# Patient Record
Sex: Female | Born: 1950 | Race: Black or African American | Hispanic: No | Marital: Single | Smoking: Never smoker
Health system: Southern US, Community
[De-identification: ages and names within clinical notes are randomized; demographics above are authoritative.]

## PROBLEM LIST (undated history)

## (undated) DIAGNOSIS — I10 Essential (primary) hypertension: Secondary | ICD-10-CM

## (undated) DIAGNOSIS — E119 Type 2 diabetes mellitus without complications: Secondary | ICD-10-CM

---

## 2004-10-09 ENCOUNTER — Ambulatory Visit: Payer: Self-pay | Admitting: General Practice

## 2006-02-06 ENCOUNTER — Emergency Department: Payer: Self-pay

## 2006-02-06 ENCOUNTER — Other Ambulatory Visit: Payer: Self-pay

## 2007-01-21 ENCOUNTER — Ambulatory Visit: Payer: Self-pay

## 2008-06-19 ENCOUNTER — Ambulatory Visit: Payer: Self-pay

## 2009-02-18 ENCOUNTER — Ambulatory Visit: Payer: Self-pay | Admitting: Nephrology

## 2009-12-18 ENCOUNTER — Ambulatory Visit: Payer: Self-pay | Admitting: Family Medicine

## 2010-09-15 ENCOUNTER — Ambulatory Visit: Payer: Self-pay | Admitting: Family Medicine

## 2011-03-06 ENCOUNTER — Ambulatory Visit: Payer: Self-pay | Admitting: Gastroenterology

## 2011-03-10 LAB — PATHOLOGY REPORT

## 2011-10-29 ENCOUNTER — Ambulatory Visit: Payer: Self-pay | Admitting: Family Medicine

## 2012-10-31 ENCOUNTER — Ambulatory Visit: Payer: Self-pay | Admitting: Nurse Practitioner

## 2013-11-23 ENCOUNTER — Ambulatory Visit: Payer: Self-pay | Admitting: Family Medicine

## 2014-05-25 ENCOUNTER — Other Ambulatory Visit: Admit: 2014-05-25 | Disposition: A | Discharge status: Home / Self Care | Payer: Self-pay | Admitting: Nephrology

## 2014-05-25 LAB — BASIC METABOLIC PANEL
Anion Gap: 4 — ABNORMAL LOW (ref 7–16)
BUN: 18 mg/dL
CALCIUM: 9.4 mg/dL
CHLORIDE: 106 mmol/L
CO2: 29 mmol/L
Creatinine: 0.9 mg/dL
EGFR (African American): >60
Glucose: 111 mg/dL — ABNORMAL HIGH
Potassium: 4.2 mmol/L
Sodium: 139 mmol/L

## 2014-05-25 LAB — URINALYSIS, COMPLETE
Bacteria: NONE SEEN
Bilirubin,UR: NEGATIVE
Glucose,UR: NEGATIVE mg/dL (ref 0–75)
Ketone: NEGATIVE
LEUKOCYTE ESTERASE: NEGATIVE
Nitrite: NEGATIVE
PH: 5 (ref 4.5–8.0)
Protein: NEGATIVE
Specific Gravity: 1.012 (ref 1.003–1.030)

## 2014-05-25 LAB — PROTEIN / CREATININE RATIO, URINE
Creatinine, Urine Random: 73 mg/dL (ref 30–125)
PROTEIN, URINE: 8 mg/dL (ref 0–9)
Protein/Creat. Ratio: 110 mg/gCREAT (ref 0–200)

## 2015-03-06 MED FILL — metFORMIN HCL 500 MG TABS: 500 | 90 days supply | Qty: 180 | Fill #0

## 2015-03-18 ENCOUNTER — Ambulatory Visit: Payer: Self-pay | Attending: Oncology

## 2015-03-18 ENCOUNTER — Ambulatory Visit
Admission: RE | Admit: 2015-03-18 | Discharge: 2015-03-18 | Disposition: A | Discharge status: Home / Self Care | Payer: Self-pay | Source: Ambulatory Visit | Attending: Oncology | Admitting: Oncology

## 2015-03-18 VITALS — BP 125/83 | HR 83 | Temp 98.3°F | Resp 16 | Ht 59.84 in | Wt 160.9 lb

## 2015-03-18 DIAGNOSIS — Z0000 Encounter for general adult medical examination without abnormal findings: Secondary | ICD-10-CM

## 2015-03-18 NOTE — Progress Notes (Signed)
Subjective:     Patient ID: Stacy Khan, female   DOB: 1950-06-26, 65 y.o.   MRN: [id number]  HPI   Review of Systems     Objective:   Physical Exam  Pulmonary/Chest: Right breast exhibits no inverted nipple, no mass, no nipple discharge, no skin change and no tenderness. Left breast exhibits no inverted nipple, no mass, no nipple discharge, no skin change and no tenderness. Breasts are symmetrical.       Assessment:     65 year old patient presents for [facility] clinic visit.  Patient screened, and meets BCCCP eligibility.  Patient does not have insurance, Medicare or Medicaid.  Handout given on Affordable Care Act. Instructed patient on breast self-exam using teach back method. CBE unremarkable.  Patient had right nephrectomy in 1980.  Wears bilateral hearing aids. Had meningitis also in 1980's.    Plan:     Sent for bilateral screening mammogram.

## 2015-03-19 NOTE — Progress Notes (Signed)
Letter mailed from [facility] to notify of normal mammogram results.  Patient to return in one year for annual screening.  Copy to [facility]. 

## 2015-04-15 MED FILL — LISINOPRIL 5 MG TABLET: 5 | 90 days supply | Qty: 90 | Fill #0

## 2015-06-12 MED FILL — metFORMIN HCL 500 MG TABS: 500 | 90 days supply | Qty: 180 | Fill #1

## 2015-08-15 MED FILL — LISINOPRIL 5 MG TABLET: 5 | 90 days supply | Qty: 90 | Fill #0

## 2015-09-26 MED FILL — metFORMIN HCL 500 MG TABS: 500 | 90 days supply | Qty: 180 | Fill #2

## 2015-12-09 MED FILL — LISINOPRIL 5 MG TABLET: 5 | 90 days supply | Qty: 90 | Fill #1

## 2015-12-23 MED FILL — metFORMIN HCL 500 MG TABS: 500 | 90 days supply | Qty: 180 | Fill #3

## 2016-03-27 MED FILL — metFORMIN HCL 500 MG TABS: 500 | 90 days supply | Qty: 180 | Fill #0

## 2016-04-01 MED FILL — LISINOPRIL 5 MG TABLET: 5 | 90 days supply | Qty: 90 | Fill #0

## 2016-04-28 ENCOUNTER — Other Ambulatory Visit: Payer: Self-pay | Admitting: Family Medicine

## 2016-04-28 DIAGNOSIS — Z1239 Encounter for other screening for malignant neoplasm of breast: Secondary | ICD-10-CM

## 2016-05-06 ENCOUNTER — Encounter: Payer: Self-pay | Admitting: Radiology

## 2016-05-06 ENCOUNTER — Ambulatory Visit
Admission: RE | Admit: 2016-05-06 | Discharge: 2016-05-06 | Disposition: A | Discharge status: Home / Self Care | Payer: Medicare HMO | Source: Ambulatory Visit | Attending: Family Medicine | Admitting: Family Medicine

## 2016-05-06 DIAGNOSIS — Z1239 Encounter for other screening for malignant neoplasm of breast: Secondary | ICD-10-CM

## 2016-05-06 DIAGNOSIS — Z1231 Encounter for screening mammogram for malignant neoplasm of breast: Secondary | ICD-10-CM | POA: Insufficient documentation

## 2016-08-21 ENCOUNTER — Encounter (HOSPITAL_COMMUNITY): Payer: Self-pay | Admitting: Emergency Medicine

## 2016-08-21 ENCOUNTER — Ambulatory Visit (HOSPITAL_COMMUNITY)
Admission: EM | Admit: 2016-08-21 | Discharge: 2016-08-21 | Disposition: A | Discharge status: Home / Self Care | Payer: Medicare HMO | Attending: Internal Medicine | Admitting: Internal Medicine

## 2016-08-21 DIAGNOSIS — R062 Wheezing: Secondary | ICD-10-CM

## 2016-08-21 DIAGNOSIS — R05 Cough: Secondary | ICD-10-CM | POA: Diagnosis not present

## 2016-08-21 DIAGNOSIS — J40 Bronchitis, not specified as acute or chronic: Secondary | ICD-10-CM | POA: Diagnosis not present

## 2016-08-21 HISTORY — DX: Essential (primary) hypertension: I10

## 2016-08-21 HISTORY — DX: Type 2 diabetes mellitus without complications: E11.9

## 2016-08-21 MED ORDER — ALBUTEROL SULFATE HFA 108 (90 BASE) MCG/ACT IN AERS: 1.0000 | INHALATION_SPRAY | Freq: Four times a day (QID) | RESPIRATORY_TRACT | 0 refills | Status: DC | PRN

## 2016-08-21 MED ORDER — ALBUTEROL SULFATE (2.5 MG/3ML) 0.083% IN NEBU
2.5000 mg | INHALATION_SOLUTION | Freq: Once | RESPIRATORY_TRACT | Status: AC
  Administered 2016-08-21: 2.5 mg via RESPIRATORY_TRACT

## 2016-08-21 MED ORDER — PROMETHAZINE-DM 6.25-15 MG/5ML PO SYRP: 5.0000 mL | ORAL_SOLUTION | Freq: Four times a day (QID) | ORAL | 0 refills | Status: DC | PRN

## 2016-08-21 MED ORDER — ALBUTEROL SULFATE (2.5 MG/3ML) 0.083% IN NEBU
INHALATION_SOLUTION | RESPIRATORY_TRACT | Status: AC
  Filled 2016-08-21: qty 3

## 2016-08-21 MED ORDER — PREDNISONE 20 MG PO TABS: 20.0000 mg | ORAL_TABLET | Freq: Every day | ORAL | 0 refills | Status: AC

## 2016-08-21 NOTE — ED Provider Notes (Signed)
CSN: [id number]     Arrival date & time 08/21/16  1624 History   First MD Initiated Contact with Patient 08/21/16 1731     Chief Complaint  Patient presents with  . URI   (Consider location/radiation/quality/duration/timing/severity/associated sxs/prior Treatment) Subjective:   Stacy Khan is a 66 y.o. female here for evaluation of a cough.  The cough is productive of clear sputum, with shortness of breath during the cough, chest is painful during coughing, waxing and waning over time and is aggravated by nothing. Onset of symptoms was 1 week ago, unchanged since that time.  Associated symptoms include postnasal drip. Patient does not have a history of asthma. Patient has not had recent travel. Patient does not have a history of smoking. Patient  has not had a previous chest x-ray. Patient has not had a PPD done. The following portions of the patient's history were reviewed and updated as appropriate: allergies, current medications, past family history, past medical history, past social history, past surgical history and problem list.          Past Medical History:  Diagnosis Date  . Diabetes mellitus without complication (HCC)   . Hypertension    History reviewed. No pertinent surgical history. Family History  Problem Relation Age of Onset  . Breast cancer Maternal Aunt    Social History  Substance Use Topics  . Smoking status: Never Smoker  . Smokeless tobacco: Never Used  . Alcohol use No   OB History    No data available     Review of Systems  Constitutional: Negative for chills, diaphoresis and fever.  HENT: Positive for postnasal drip, sinus pain and sinus pressure.   Respiratory: Positive for cough and shortness of breath.   Cardiovascular: Positive for chest pain.  All other systems reviewed and are negative.   Allergies  Patient has no known allergies.  Home Medications   Prior to Admission medications   Medication Sig Start Date End Date Taking?  Authorizing Provider  lisinopril (PRINIVIL,ZESTRIL) 20 MG tablet Take 20 mg by mouth daily.   Yes [provider]  metFORMIN (GLUCOPHAGE) 500 MG tablet Take by mouth 2 (two) times daily with a meal.   Yes [provider]  albuterol (PROVENTIL HFA;VENTOLIN HFA) 108 (90 Base) MCG/ACT inhaler Inhale 1-2 puffs into the lungs every 6 (six) hours as needed (cough, shortness of breath, wheezing). 08/21/16   Lurline IdolMurrill, Jakyiah Briones, FNP  predniSONE (DELTASONE) 20 MG tablet Take 1 tablet (20 mg total) by mouth daily. 08/21/16 08/26/16  Lurline IdolMurrill, Megen Madewell, FNP  promethazine-dextromethorphan (PROMETHAZINE-DM) 6.25-15 MG/5ML syrup Take 5 mLs by mouth 4 (four) times daily as needed for cough. 08/21/16   Lurline IdolMurrill, Brandn Mcgath, FNP   Meds Ordered and Administered this Visit   Medications  albuterol (PROVENTIL) (2.5 MG/3ML) 0.083% nebulizer solution 2.5 mg (not administered)    BP (!) 145/88 (BP Location: Left Arm)   Pulse 84   Temp 98.6 F (37 C) (Oral)   Resp 20   SpO2 98%  No data found.   Physical Exam  Constitutional: She is oriented to person, place, and time. She appears well-developed and well-nourished.  HENT:  Head: Normocephalic.  Eyes: Pupils are equal, round, and reactive to light. Conjunctivae and EOM are normal.  Neck: Normal range of motion.  Cardiovascular: Normal rate and regular rhythm.   Pulmonary/Chest: Effort normal. No respiratory distress. She has wheezes.  Musculoskeletal: Normal range of motion.  Neurological: She is alert and oriented to person, place, and time.  Skin: Skin is warm and dry.  Psychiatric: She has a normal mood and affect.    Urgent Care Course     Procedures (including critical care time)  Labs Review Labs Reviewed - No data to display  Imaging Review No results found.   Visual Acuity Review  Right Eye Distance:   Left Eye Distance:   Bilateral Distance:    Right Eye Near:   Left Eye Near:    Bilateral Near:         MDM    1. Bronchitis    Patient with mild expiratory wheezing. Will administer albuterol inhaler in clinic.   Discharge home with antitussives, steroids and albuterol inhaler.   Patient informed to return to clinic or go to ER if shortness of breath worsens, blood in sputum, change in character of cough, development of fever or chills, inability to maintain nutrition and hydration. She is to also avoid exposure to tobacco smoke and fumes.  Discussed diagnosis and treatment with patient. All questions have been answered and all concerns have been addressed. The patient verbalized understanding and had no further questions     Lurline Idol, [place] 08/21/16 1738

## 2016-08-21 NOTE — ED Triage Notes (Signed)
Pt c/o cold sx onset: 6 days  Sx include: prod cough, nasal drainage, nausea, chest soreness due to cough  Denies: fevers  Taking: OTC cold meds w/temp relief.   A&O x4... NAD

## 2017-03-21 ENCOUNTER — Encounter (HOSPITAL_COMMUNITY): Payer: Self-pay | Admitting: Emergency Medicine

## 2017-03-21 ENCOUNTER — Other Ambulatory Visit: Payer: Self-pay

## 2017-03-21 ENCOUNTER — Ambulatory Visit (HOSPITAL_COMMUNITY)
Admission: EM | Admit: 2017-03-21 | Discharge: 2017-03-21 | Disposition: A | Discharge status: Home / Self Care | Payer: Medicare HMO | Attending: Internal Medicine | Admitting: Internal Medicine

## 2017-03-21 DIAGNOSIS — M79602 Pain in left arm: Secondary | ICD-10-CM

## 2017-03-21 DIAGNOSIS — M542 Cervicalgia: Secondary | ICD-10-CM | POA: Diagnosis not present

## 2017-03-21 DIAGNOSIS — M546 Pain in thoracic spine: Secondary | ICD-10-CM

## 2017-03-21 DIAGNOSIS — M62830 Muscle spasm of back: Secondary | ICD-10-CM

## 2017-03-21 MED ORDER — MELOXICAM 7.5 MG PO TABS: 7.5000 mg | ORAL_TABLET | Freq: Every day | ORAL | 0 refills | Status: DC

## 2017-03-21 MED ORDER — TIZANIDINE HCL 2 MG PO TABS: 2.0000 mg | ORAL_TABLET | Freq: Two times a day (BID) | ORAL | 0 refills | Status: DC | PRN

## 2017-03-21 NOTE — ED Provider Notes (Signed)
[facility]    CSN: [id number] Arrival date & time: 03/21/17  1233     History   Chief Complaint Chief Complaint  Patient presents with  . Back Pain    HPI Stacy Khan is a 67 y.o. female.   [place] presents with complaints of left upper back, neck and arm pain which started 9 days ago. She is right handed. States it felt like she pulled a muscle. It is worse with certain movement. She has applied heat, taking BC powder, tylenol and applied arthritis cream which has not helped. Without known injury, fall, previous neck injury. Pain is constant. Without nausea, shortness of breath or cough. Moving her neck increases the pain. Yesterday her left upper arm felt numb and tingling, this has improved. Mild pain currently. History of DM and htn. No longer takes BM medications.    ROS per HPI.       Past Medical History:  Diagnosis Date  . Diabetes mellitus without complication (HCC)   . Hypertension     There are no active problems to display for this patient.   History reviewed. No pertinent surgical history.  OB History    No data available       Home Medications    Prior to Admission medications   Medication Sig Start Date End Date Taking? Authorizing Provider  albuterol (PROVENTIL HFA;VENTOLIN HFA) 108 (90 Base) MCG/ACT inhaler Inhale 1-2 puffs into the lungs every 6 (six) hours as needed (cough, shortness of breath, wheezing). 08/21/16   Lurline Idol, FNP  meloxicam (MOBIC) 7.5 MG tablet Take 1 tablet (7.5 mg total) by mouth daily. 03/21/17   Georgetta Haber, NP  metFORMIN (GLUCOPHAGE) 500 MG tablet Take by mouth 2 (two) times daily with a meal.    [provider]  promethazine-dextromethorphan (PROMETHAZINE-DM) 6.25-15 MG/5ML syrup Take 5 mLs by mouth 4 (four) times daily as needed for cough. 08/21/16   Lurline Idol, FNP  tiZANidine (ZANAFLEX) 2 MG tablet Take 1 tablet (2 mg total) by mouth 2 (two) times daily as needed for muscle  spasms. 03/21/17   Georgetta Haber, NP    Family History Family History  Problem Relation Age of Onset  . Breast cancer Maternal Aunt     Social History Social History   Tobacco Use  . Smoking status: Never Smoker  . Smokeless tobacco: Never Used  Substance Use Topics  . Alcohol use: No  . Drug use: No     Allergies   Patient has no known allergies.   Review of Systems Review of Systems   Physical Exam Triage Vital Signs ED Triage Vitals  Enc Vitals Group     BP 03/21/17 1420 (!) 154/86     Pulse Rate 03/21/17 1420 67     Resp 03/21/17 1420 20     Temp 03/21/17 1420 97.9 F (36.6 C)     Temp Source 03/21/17 1420 Oral     SpO2 03/21/17 1420 97 %     Weight --      Height --      Head Circumference --      Peak Flow --      Pain Score 03/21/17 1429 8     Pain Loc --      Pain Edu? --      Excl. in GC? --    No data found.  Updated Vital Signs BP (!) 154/86 (BP Location: Left Arm)   Pulse 67   Temp 97.9  F (36.6 C) (Oral)   Resp 20   SpO2 97%   Visual Acuity Right Eye Distance:   Left Eye Distance:   Bilateral Distance:    Right Eye Near:   Left Eye Near:    Bilateral Near:     Physical Exam  Constitutional: She is oriented to person, place, and time. She appears well-developed and well-nourished. No distress.  Cardiovascular: Normal rate, regular rhythm, normal heart sounds and intact distal pulses.  Pulmonary/Chest: Effort normal and breath sounds normal.  Musculoskeletal:       Left shoulder: She exhibits normal range of motion, no tenderness, no bony tenderness and no swelling.       Cervical back: She exhibits tenderness. She exhibits normal range of motion, no bony tenderness, no pain and normal pulse.       Thoracic back: She exhibits tenderness. She exhibits no bony tenderness, no swelling, no deformity and normal pulse.       Back:       Left upper arm: She exhibits no tenderness, no bony tenderness, no swelling and no edema.  Left  trapezius with tenderness on palpation; left arm without pain; full shoulder ROM without bony tenderness; without spinous process tenderness; full neck ROM; sensation intact to left arm, strong radial pulse  Neurological: She is alert and oriented to person, place, and time.  Skin: Skin is warm and dry.   EKG without acute changes, NSR 62  UC Treatments / Results  Labs (all labs ordered are listed, but only abnormal results are displayed) Labs Reviewed - No data to display  EKG  EKG Interpretation None       Radiology No results found.  Procedures Procedures (including critical care time)  Medications Ordered in UC Medications - No data to display   Initial Impression / Assessment and Plan / UC Course  I have reviewed the triage vital signs and the nursing notes.  Pertinent labs & imaging results that were available during my care of the patient were reviewed by me and considered in my medical decision making (see chart for details).   Muscular pain reproducible with palpation, trapezius primarily affected. ROM exercises, NSAID's daily, muscle relaxer recommended. Without shortness of breath, chest pain, nausea associated with pain, and ekg without acute changes. Low suspicion for ACS. Return precautions provided. Encouraged follow up with PCP for reevaluation as may need further evaluation and treatment if symptoms persist. Patient verbalized understanding and agreeable to plan.    Final Clinical Impressions(s) / UC Diagnoses   Final diagnoses:  Muscle spasm of back    ED Discharge Orders        Ordered    meloxicam (MOBIC) 7.5 MG tablet  Daily     03/21/17 1517    tiZANidine (ZANAFLEX) 2 MG tablet  2 times daily PRN     03/21/17 1517       Controlled Substance Prescriptions [place] Controlled Substance Registry consulted? Not Applicable   Georgetta HaberBurky, Natalie B, NP 03/21/17 [phone]

## 2017-03-21 NOTE — ED Triage Notes (Signed)
Pain in left shoulder blade, radiates into neck and down back of left arm.  Symptoms for 9 days.  No known injury.  Patient thought this to be a pulled muscle.  Patient taking bc's, sleeping on floor, and using a heating pad with no relief.  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

## 2017-03-21 NOTE — Discharge Instructions (Signed)
Ice and/or heat application, ROM activities to keep muscles lose. Daily meloxicam to help with pain. May use muscle relaxer twice a day as needed, may cause drowsiness so do not take if drinking alcohol or driving a vehicle. Please make appointment to follow up with primary care provider for recheck of your symptoms. If develop chest pain, palpitations, nausea, numbness or tingling to arm or otherwise worsening please return to be seen immediately.

## 2017-03-25 ENCOUNTER — Other Ambulatory Visit: Payer: Self-pay

## 2017-03-25 ENCOUNTER — Encounter (HOSPITAL_COMMUNITY): Payer: Self-pay | Admitting: Emergency Medicine

## 2017-03-25 ENCOUNTER — Emergency Department (HOSPITAL_COMMUNITY): Payer: Medicare HMO

## 2017-03-25 ENCOUNTER — Emergency Department (HOSPITAL_COMMUNITY)
Admission: EM | Admit: 2017-03-25 | Discharge: 2017-03-25 | Disposition: A | Discharge status: Home / Self Care | Payer: Medicare HMO | Attending: Emergency Medicine | Admitting: Emergency Medicine

## 2017-03-25 DIAGNOSIS — M546 Pain in thoracic spine: Secondary | ICD-10-CM | POA: Insufficient documentation

## 2017-03-25 DIAGNOSIS — Z7984 Long term (current) use of oral hypoglycemic drugs: Secondary | ICD-10-CM | POA: Diagnosis not present

## 2017-03-25 DIAGNOSIS — Z79899 Other long term (current) drug therapy: Secondary | ICD-10-CM | POA: Insufficient documentation

## 2017-03-25 DIAGNOSIS — E119 Type 2 diabetes mellitus without complications: Secondary | ICD-10-CM | POA: Diagnosis not present

## 2017-03-25 DIAGNOSIS — I10 Essential (primary) hypertension: Secondary | ICD-10-CM | POA: Diagnosis not present

## 2017-03-25 LAB — CBC
HCT: 40.9 % (ref 36.0–46.0)
Hemoglobin: 13.9 g/dL (ref 12.0–15.0)
MCH: 29.5 pg (ref 26.0–34.0)
MCHC: 34 g/dL (ref 30.0–36.0)
MCV: 86.8 fL (ref 78.0–100.0)
PLATELETS: 264 10*3/uL (ref 150–400)
RBC: 4.71 MIL/uL (ref 3.87–5.11)
RDW: 13.7 % (ref 11.5–15.5)
WBC: 7.8 10*3/uL (ref 4.0–10.5)

## 2017-03-25 LAB — BASIC METABOLIC PANEL
ANION GAP: 11 (ref 5–15)
BUN: 12 mg/dL (ref 6–20)
CO2: 24 mmol/L (ref 22–32)
Calcium: 10 mg/dL (ref 8.9–10.3)
Chloride: 103 mmol/L (ref 101–111)
Creatinine, Ser: 0.92 mg/dL (ref 0.44–1.00)
GFR calc Af Amer: >60 mL/min (ref >60)
Glucose, Bld: 118 mg/dL — ABNORMAL HIGH (ref 65–99)
POTASSIUM: 4.5 mmol/L (ref 3.5–5.1)
SODIUM: 138 mmol/L (ref 135–145)

## 2017-03-25 LAB — I-STAT TROPONIN, ED: TROPONIN I, POC: 0 ng/mL (ref 0.00–0.08)

## 2017-03-25 LAB — URINALYSIS, ROUTINE W REFLEX MICROSCOPIC
Bacteria, UA: NONE SEEN
Bilirubin Urine: NEGATIVE
GLUCOSE, UA: NEGATIVE mg/dL
Ketones, ur: NEGATIVE mg/dL
Nitrite: NEGATIVE
Protein, ur: 100 mg/dL — AB
SPECIFIC GRAVITY, URINE: 1.014 (ref 1.005–1.030)
pH: 5 (ref 5.0–8.0)

## 2017-03-25 MED ORDER — ACETAMINOPHEN 325 MG PO TABS: 1000.0000 mg | ORAL_TABLET | Freq: Four times a day (QID) | ORAL | Status: DC | PRN

## 2017-03-25 MED ORDER — LIDOCAINE 5 % EX PTCH: 1.0000 | MEDICATED_PATCH | CUTANEOUS | 0 refills | Status: DC

## 2017-03-25 MED ORDER — ACETAMINOPHEN 500 MG PO TABS
1000.0000 mg | ORAL_TABLET | Freq: Once | ORAL | Status: AC
  Administered 2017-03-25: 1000 mg via ORAL
  Filled 2017-03-25: qty 2

## 2017-03-25 MED ORDER — METHYLPREDNISOLONE 4 MG PO TBPK: ORAL_TABLET | ORAL | 0 refills | Status: DC

## 2017-03-25 MED ORDER — LIDOCAINE 5 % EX PTCH
1.0000 | MEDICATED_PATCH | CUTANEOUS | Status: DC
  Administered 2017-03-25: 1 via TRANSDERMAL
  Filled 2017-03-25: qty 1

## 2017-03-25 NOTE — ED Notes (Signed)
Called x3 with no response 

## 2017-03-25 NOTE — Discharge Instructions (Signed)
Discontinue gabapentin, meloxicam

## 2017-03-25 NOTE — ED Notes (Signed)
Called for triage x1-no answer. 

## 2017-03-25 NOTE — ED Provider Notes (Signed)
[facility] Note   CSN: [id number] Arrival date & time: 03/25/17  1609     History   Chief Complaint Chief Complaint  Patient presents with  . Back Pain    HPI Stacy Khan is a 67 y.o. female.  HPI  67 year old female history of diabetes, hypertension presents emergency department after being evaluated/multiple visits for new onset left sided thoracic back pain started 12 days ago.  Patient has been given conservative therapy that includes anti-inflammatories, antispasmodics and gabapentin (non compliant with meds/felt it makes pain worse or has nausea with meds).  Patient denies any chest pain, shortness of breath, recent injury/illness.  Patient denies any voiding symptoms.  Patient denies any discrete muscular weakness/paralysis but endorses a stinging sensation.  Works as a Advertising copywriterhousekeeper.   Past Medical History:  Diagnosis Date  . Diabetes mellitus without complication (HCC)   . Hypertension     There are no active problems to display for this patient.   History reviewed. No pertinent surgical history.  OB History    No data available       Home Medications    Prior to Admission medications   Medication Sig Start Date End Date Taking? Authorizing Provider  albuterol (PROVENTIL HFA;VENTOLIN HFA) 108 (90 Base) MCG/ACT inhaler Inhale 1-2 puffs into the lungs every 6 (six) hours as needed (cough, shortness of breath, wheezing). 08/21/16   Lurline IdolMurrill, Samantha, FNP  meloxicam (MOBIC) 7.5 MG tablet Take 1 tablet (7.5 mg total) by mouth daily. 03/21/17   Georgetta HaberBurky, Natalie B, NP  metFORMIN (GLUCOPHAGE) 500 MG tablet Take by mouth 2 (two) times daily with a meal.    [provider]  promethazine-dextromethorphan (PROMETHAZINE-DM) 6.25-15 MG/5ML syrup Take 5 mLs by mouth 4 (four) times daily as needed for cough. 08/21/16   Lurline IdolMurrill, Samantha, FNP  tiZANidine (ZANAFLEX) 2 MG tablet Take 1 tablet (2 mg total) by mouth 2 (two) times  daily as needed for muscle spasms. 03/21/17   Georgetta HaberBurky, Natalie B, NP    Family History Family History  Problem Relation Age of Onset  . Breast cancer Maternal Aunt     Social History Social History   Tobacco Use  . Smoking status: Never Smoker  . Smokeless tobacco: Never Used  Substance Use Topics  . Alcohol use: No  . Drug use: No     Allergies   Patient has no known allergies.   Review of Systems Review of Systems  Review of Systems  Constitutional: Negative for fever and chills.  HENT: Negative for ear pain, sore throat and trouble swallowing.   Eyes: Negative for pain and visual disturbance.  Respiratory: Negative for cough and shortness of breath.   Cardiovascular: Negative for chest pain and leg swelling.  Gastrointestinal: Negative for nausea, vomiting, abdominal pain and diarrhea.  Genitourinary: Negative for dysuria, urgency and frequency.  Musculoskeletal: upper back pain  Skin: Negative for rash and wound.  Neurological: Negative for dizziness, syncope, speech difficulty, weakness and numbness.   Physical Exam Updated Vital Signs There were no vitals taken for this visit.  Physical Exam  Physical Exam There were no vitals filed for this visit. Constitutional: Patient is in no acute distress Head: Normocephalic and atraumatic.  Eyes: Extraocular motion intact, no scleral icterus Neck: Supple without meningismus, mass, or overt JVD Respiratory: Effort normal and breath sounds normal. No respiratory distress. CV: Heart regular rate and rhythm, no obvious murmurs.  Pulses +2 and symmetric Abdomen: Soft, non-tender, non-distended MSK: +  TTP L paravertebral T 7 with normal overlying skin; neg midline TTP; UE fAROM MS 5/5 NVI Skin: Warm, dry, intact Neuro: Alert and oriented, no motor deficit noted Psychiatric: Mood and affect are normal.  ED Treatments / Results  Labs (all labs ordered are listed, but only abnormal results are displayed) Labs Reviewed    CBC  BASIC METABOLIC PANEL  URINALYSIS, ROUTINE W REFLEX MICROSCOPIC  I-STAT TROPONIN, ED    EKG  EKG Interpretation None       Radiology Dg Chest 2 View  Result Date: 03/25/2017 CLINICAL DATA:  Upper back pain for several days, initial encounter EXAM: CHEST  2 VIEW COMPARISON:  02/06/2006 FINDINGS: The heart size and mediastinal contours are within normal limits. Both lungs are clear. The visualized skeletal structures are unremarkable. Postsurgical changes are noted in the upper abdomen on the right. IMPRESSION: No active cardiopulmonary disease. Electronically Signed   By: Alcide Clever M.D.   On: 03/25/2017 20:21    Procedures Procedures (including critical care time)  Medications Ordered in ED Medications  acetaminophen (TYLENOL) tablet 1,000 mg (not administered)     Initial Impression / Assessment and Plan / ED Course  I have reviewed the triage vital signs and the nursing notes.  Pertinent labs & imaging results that were available during my care of the patient were reviewed by me and considered in my medical decision making (see chart for details).     67 year old female history of diabetes, hypertension presents emergency department after being evaluated/multiple visits for new onset left sided thoracic back pain started 12 days ago.  Patient has been given conservative therapy that includes anti-inflammatories, antispasmodics and gabapentin (non compliant with meds/felt it makes pain worse or has nausea with meds).  Patient denies any chest pain, shortness of breath, recent injury/illness.  Patient denies any voiding symptoms.  Patient denies any discrete muscular weakness/paralysis but endorses a stinging sensation.  Works as a Advertising copywriter.  Patient arrives here medically stable and well-appearing.  Physical exam as annotated above.  There is reproducible pain at the level of T7 left paravertebral with normal overlying skin not concerning for zoster.  Patient has no  functional deficit on physical exam.  Review of chest x-ray no findings for acute pathology.  Labs not concerning for acute pathology.  EKG no findings concerning for ACS/arrhythmia.  Patient has been noncompliant with medications to include those listed above.  Patient's states they have increased her pain/made a throbbing feeling or some of the medication have made her nausea.  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  History/clinical presentation is consistent with musculoskeletal pain with no deficits.  Plan for optimizing current medications with scheduled Tylenol 1000 mg every 6 hours along with Lidoderm patches along with medrol dosepak.  Patient is instructed to take antispasmodics at night and discontinue the gabapentin/Mobic.  Patient understands the plan will be discharged home with follow-up to primary care provider.    Final Clinical Impressions(s) / ED Diagnoses   Final diagnoses:  None    ED Discharge Orders    None       Jaynie Collins, DO 03/25/17 2214    Charlynne Pander, MD 03/26/17 [phone]

## 2017-03-25 NOTE — ED Triage Notes (Signed)
Patient with 12 days of back pain, has been seen at multiple UCCs and given meds with no relief of pain.  She denies any injury to the area.  Denies any chest pain, shortness of breath.  She denies any urinary symptoms.  The pain is sharp, stinging in nature.   Patient is HOH with hearing aid in left ear.

## 2017-03-25 NOTE — ED Provider Notes (Signed)
Patient placed in Quick Look pathway, seen and evaluated   Chief Complaint: Back Pain  HPI:  Upper left-sided thoracic back pain began 12days ago. Pain radiates to the left neck, shoulder and arm. Went to urgent care last week who diagnosed her with MSK pain and prescribed baclofen, meloxicam and gabapentin. Patient states these medications are not working.  ROS: No SOB, diaphoresis, N/V or lightheadedness  Physical Exam:   Gen: No distress  Neuro: Awake and Alert  Skin: Warm    Focused Exam: Lungs CTA. Heart rate and rhythm regular. Tender to touch over the left upper back, no bruising, rash or wound noted.    Initiation of care has begun. The patient has been counseled on the process, plan, and necessity for staying for the completion/evaluation, and the remainder of the medical screening examination    Lawrence MarseillesShrosbree, Albirtha Grinage J, PA-C 03/25/17 Candelaria Celeste2001    Wentz, Elliott, MD 03/26/17 1137

## 2017-04-05 ENCOUNTER — Other Ambulatory Visit: Payer: Self-pay | Admitting: Family Medicine

## 2017-04-05 DIAGNOSIS — Z1239 Encounter for other screening for malignant neoplasm of breast: Secondary | ICD-10-CM

## 2017-04-13 ENCOUNTER — Ambulatory Visit (INDEPENDENT_AMBULATORY_CARE_PROVIDER_SITE_OTHER): Payer: Medicare HMO

## 2017-04-13 ENCOUNTER — Encounter (INDEPENDENT_AMBULATORY_CARE_PROVIDER_SITE_OTHER): Payer: Self-pay | Admitting: Orthopaedic Surgery

## 2017-04-13 ENCOUNTER — Ambulatory Visit (INDEPENDENT_AMBULATORY_CARE_PROVIDER_SITE_OTHER): Payer: Medicare HMO | Admitting: Orthopaedic Surgery

## 2017-04-13 DIAGNOSIS — M542 Cervicalgia: Secondary | ICD-10-CM | POA: Diagnosis not present

## 2017-04-13 DIAGNOSIS — G8929 Other chronic pain: Secondary | ICD-10-CM

## 2017-04-13 DIAGNOSIS — M25512 Pain in left shoulder: Secondary | ICD-10-CM

## 2017-04-13 MED ORDER — TRAMADOL HCL 50 MG PO TABS: ORAL_TABLET | ORAL | 1 refills | Status: DC

## 2017-04-13 NOTE — Progress Notes (Signed)
Office Visit Note   Patient: Stacy Khan           Date of Birth: 09/19/1950           MRN: [id number] Visit Date: 04/13/2017              Requested by: No referring provider defined for this encounter. PCP: System, Provider Not In   Assessment & Plan: Visit Diagnoses:  1. Chronic left shoulder pain   2. Neck pain   3. Cervicalgia     Plan: Impression is cervical spine radiculopathy.  At this point, Ms. Stacy Khan is tried and failed a steroid Dosepak as well as muscle relaxers.  We will go ahead and get an MRI of her cervical spine to assess this further.  She will follow-up with us once this is completed.  Follow-Up Instructions: Return in about 10 days (around 04/23/2017) for MRI discussion.   Orders:  Orders Placed This Encounter  Procedures  . XR Shoulder Left  . XR Cervical Spine 2 or 3 views  . MR Cervical Spine w/o contrast   Meds ordered this encounter  Medications  . traMADol (ULTRAM) 50 MG tablet    Sig: TAKE 1 TAB PO EVERY 6-8 HOURS PRN PAIN    Dispense:  30 tablet    Refill:  1      Procedures: No procedures performed   Clinical Data: No additional findings.   Subjective: Chief Complaint  Patient presents with  . Left Shoulder - Pain    HPI Ms. Stacy Khan is a pleasant 67 year old female who presents to our clinic today with left-sided neck and shoulder pain.  This began approximately 1 month ago without any known injury or change in activity.  She woke up one morning feeling as though she had a crick in her neck but the pain never went away.  In fact, it started going into the parascapular region and down her left arm into her hand.  She was seen in urgent care setting followed by the emergency department a few days later.  They told her she likely had a pulled muscle and she was given a steroid Dosepak as well as muscle relaxer.  These have been of minimal relief.  She continues to have the pain.  There is nothing that specifically aggravates it but really  nothing that makes it better.  She is having numbness almost all of the time to her index long ring and small finger.  No previous shoulder or neck pathology.  Review of Systems review of systems as detailed in HPI.  All others reviewed and are negative.   Objective: Vital Signs: There were no vitals taken for this visit.  Physical Exam well-developed well-nourished female no acute distress.  Alert and oriented x3.  Ortho Exam examination of the neck reveals pain with lateral rotation to the right.  She does have the moderate parascapular trigger point tenderness.  No spinous or paraspinous tenderness.  Examination of the left shoulder reveals full forward flexion, external and internal rotation.  Decreased sensation to the left hand.  Specialty Comments:  No specialty comments available.  Imaging: Xr Cervical Spine 2 Or 3 Views  Result Date: 04/13/2017 X-rays of the cervical spine C5-6  Xr Shoulder Left  Result Date: 04/13/2017 X-rays of the left shoulder reveal a type II acromion.  Moderate AC degenerative changes.  Okay glenohumeral space.    PMFS History: There are no active problems to display for this patient.  Past Medical  History:  Diagnosis Date  . Diabetes mellitus without complication (HCC)   . Hypertension     Family History  Problem Relation Age of Onset  . Breast cancer Maternal Aunt     History reviewed. No pertinent surgical history. Social History   Occupational History  . Not on file  Tobacco Use  . Smoking status: Never Smoker  . Smokeless tobacco: Never Used  Substance and Sexual Activity  . Alcohol use: No  . Drug use: No  . Sexual activity: Not on file

## 2017-04-27 ENCOUNTER — Other Ambulatory Visit: Payer: Medicare HMO

## 2017-05-04 ENCOUNTER — Ambulatory Visit
Admission: RE | Admit: 2017-05-04 | Discharge: 2017-05-04 | Disposition: A | Discharge status: Home / Self Care | Payer: Medicare HMO | Source: Ambulatory Visit | Attending: Physician Assistant | Admitting: Physician Assistant

## 2017-05-04 ENCOUNTER — Telehealth (INDEPENDENT_AMBULATORY_CARE_PROVIDER_SITE_OTHER): Payer: Self-pay

## 2017-05-04 DIAGNOSIS — M542 Cervicalgia: Secondary | ICD-10-CM

## 2017-05-04 NOTE — Telephone Encounter (Signed)
CAlled patient, No answer. LMOM she just needs to make appt with Dr Roda ShuttersXu for MRI REVIEW.

## 2017-05-04 NOTE — Progress Notes (Signed)
Follow up appointment please

## 2017-05-10 ENCOUNTER — Ambulatory Visit (INDEPENDENT_AMBULATORY_CARE_PROVIDER_SITE_OTHER): Payer: Medicare HMO | Admitting: Orthopaedic Surgery

## 2017-05-10 ENCOUNTER — Encounter (INDEPENDENT_AMBULATORY_CARE_PROVIDER_SITE_OTHER): Payer: Self-pay | Admitting: Orthopaedic Surgery

## 2017-05-10 DIAGNOSIS — M542 Cervicalgia: Secondary | ICD-10-CM | POA: Diagnosis not present

## 2017-05-10 MED ORDER — TRAMADOL HCL 50 MG PO TABS: ORAL_TABLET | ORAL | 0 refills | Status: DC

## 2017-05-10 NOTE — Progress Notes (Signed)
Patient comes in to discuss cervical spine MRI.  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  This also shows protruding disc material from osteophytes at C4-5, C5-6 and C6-7.  At this point, we will refer the patient to Dr. Alvester MorinNewton for epidural steroid injection.  I will also refill her tramadol.  She will follow-up with us on an as-needed basis.  Call with concerns or questions in the meantime.

## 2017-05-11 ENCOUNTER — Telehealth (INDEPENDENT_AMBULATORY_CARE_PROVIDER_SITE_OTHER): Payer: Self-pay | Admitting: Orthopaedic Surgery

## 2017-05-11 ENCOUNTER — Ambulatory Visit
Admission: RE | Admit: 2017-05-11 | Discharge: 2017-05-11 | Disposition: A | Discharge status: Home / Self Care | Payer: Medicare HMO | Source: Ambulatory Visit | Attending: Family Medicine | Admitting: Family Medicine

## 2017-05-11 DIAGNOSIS — Z1231 Encounter for screening mammogram for malignant neoplasm of breast: Secondary | ICD-10-CM | POA: Diagnosis present

## 2017-05-11 DIAGNOSIS — Z1239 Encounter for other screening for malignant neoplasm of breast: Secondary | ICD-10-CM

## 2017-05-11 NOTE — Telephone Encounter (Signed)
Patient called saying that her Tramadol RX wasn't received at her pharmacy. If it could be sent in again. Thank you. CB # [phone]

## 2017-05-11 NOTE — Telephone Encounter (Signed)
Called Rx again into pharm

## 2017-05-20 ENCOUNTER — Other Ambulatory Visit: Payer: Self-pay | Admitting: Family Medicine

## 2017-05-20 DIAGNOSIS — Z13820 Encounter for screening for osteoporosis: Secondary | ICD-10-CM

## 2017-06-10 ENCOUNTER — Encounter (INDEPENDENT_AMBULATORY_CARE_PROVIDER_SITE_OTHER): Payer: Self-pay | Admitting: Physical Medicine and Rehabilitation

## 2017-07-06 ENCOUNTER — Encounter (HOSPITAL_COMMUNITY): Payer: Self-pay | Admitting: Emergency Medicine

## 2017-07-06 ENCOUNTER — Ambulatory Visit (HOSPITAL_COMMUNITY)
Admission: EM | Admit: 2017-07-06 | Discharge: 2017-07-06 | Disposition: A | Discharge status: Home / Self Care | Payer: Medicare HMO | Attending: Family Medicine | Admitting: Family Medicine

## 2017-07-06 ENCOUNTER — Other Ambulatory Visit: Payer: Self-pay

## 2017-07-06 DIAGNOSIS — M26622 Arthralgia of left temporomandibular joint: Secondary | ICD-10-CM

## 2017-07-06 MED ORDER — PREDNISONE 10 MG (21) PO TBPK: ORAL_TABLET | ORAL | 0 refills | Status: DC

## 2017-07-06 NOTE — ED Provider Notes (Signed)
[facility]    CSN: [id number] Arrival date & time: 07/06/17  1214     History   Chief Complaint Chief Complaint  Patient presents with  . Otalgia    HPI Stacy Khan is a 67 y.o. female.   HPI  Patient woke up on Friday, earlier than usual, with severe pain in her left ear area.  No difficulty hearing.  No cough cold or runny nose symptoms.  She normally wears hearing aids but cannot stand to have this in place.  She states that hurts when she bites or chews.  She is never had trouble with her teeth or jaw joint before. She has diabetes that she states is well controlled.  She is on metformin She has hypertension that she states is well controlled.  She is on amlodipine  Past Medical History:  Diagnosis Date  . Diabetes mellitus without complication (HCC)   . Hypertension     Patient Active Problem List   Diagnosis Date Noted  . Cervicalgia 05/10/2017    History reviewed. No pertinent surgical history.  OB History   None      Home Medications    Prior to Admission medications   Medication Sig Start Date End Date Taking? Authorizing Provider  metFORMIN (GLUCOPHAGE) 500 MG tablet Take by mouth 2 (two) times daily with a meal.   Yes [provider]  predniSONE (STERAPRED UNI-PAK 21 TAB) 10 MG (21) TBPK tablet tad 07/06/17   Eustace Moore, MD    Family History Family History  Problem Relation Age of Onset  . Breast cancer Maternal Aunt     Social History Social History   Tobacco Use  . Smoking status: Never Smoker  . Smokeless tobacco: Never Used  Substance Use Topics  . Alcohol use: No  . Drug use: No     Allergies   Patient has no known allergies.   Review of Systems Review of Systems  Constitutional: Negative for chills and fever.  HENT: Positive for ear pain. Negative for sore throat.   Eyes: Negative for pain and visual disturbance.  Respiratory: Negative for cough and shortness of breath.   Cardiovascular:  Negative for chest pain and palpitations.  Gastrointestinal: Negative for abdominal pain and vomiting.  Genitourinary: Negative for dysuria and hematuria.  Musculoskeletal: Negative for arthralgias and back pain.  Skin: Negative for color change and rash.  Neurological: Negative for seizures and syncope.  All other systems reviewed and are negative.    Physical Exam Triage Vital Signs ED Triage Vitals  Enc Vitals Group     BP 07/06/17 1257 139/85     Pulse Rate 07/06/17 1257 79     Resp 07/06/17 1257 18     Temp 07/06/17 1257 98.9 F (37.2 C)     Temp Source 07/06/17 1257 Oral     SpO2 07/06/17 1257 98 %     Weight --      Height --      Head Circumference --      Peak Flow --      Pain Score 07/06/17 1255 8     Pain Loc --      Pain Edu? --      Excl. in [place]? --    No data found.  Updated Vital Signs BP 139/85 (BP Location: Left Arm)   Pulse 79   Temp 98.9 F (37.2 C) (Oral)   Resp 18   SpO2 98%   Visual Acuity Right Eye  Distance:   Left Eye Distance:   Bilateral Distance:    Right Eye Near:   Left Eye Near:    Bilateral Near:     Physical Exam  Constitutional: She appears well-developed and well-nourished. No distress.  HENT:  Head: Normocephalic and atraumatic.  Right Ear: External ear normal.  Left Ear: External ear normal.  Mouth/Throat: Oropharynx is clear and moist.  No pain with traction of the pinna.  Canals and TMs are normal bilaterally.  Tenderness directly over the TMJ.  Pain with opening and closing mouth.  Dentition normal.  Eyes: Pupils are equal, round, and reactive to light. Conjunctivae are normal.  Neck: Normal range of motion. Neck supple.  Cardiovascular: Normal rate.  Pulmonary/Chest: Effort normal. No respiratory distress.  Abdominal: Soft. She exhibits no distension.  Musculoskeletal: Normal range of motion. She exhibits no edema.  Lymphadenopathy:    She has no cervical adenopathy.  Neurological: She is alert.  Skin: Skin is  warm and dry.     UC Treatments / Results  Labs (all labs ordered are listed, but only abnormal results are displayed) Labs Reviewed - No data to display  EKG None  Radiology No results found.  Procedures Procedures (including critical care time)  Medications Ordered in UC Medications - No data to display  Initial Impression / Assessment and Plan / UC Course  I have reviewed the triage vital signs and the nursing notes.  Pertinent labs & imaging results that were available during my care of the patient were reviewed by me and considered in my medical decision making (see chart for details).     Discussed TMJ.  Ear exam normal.  Hearing is normal.  Discussed reasons for TMJ.  TMJ handout is given. Final Clinical Impressions(s) / UC Diagnoses   Final diagnoses:  Arthralgia of left temporomandibular joint     Discharge Instructions     Take the prednisone as directed Take all of day one today Use warm compress to jaw area SOFT food only a few days May take ibuprofen for pain AFTER the prednisone Follow up with your doctor if not better in a week    ED Prescriptions    Medication Sig Dispense Auth. Provider   predniSONE (STERAPRED UNI-PAK 21 TAB) 10 MG (21) TBPK tablet tad 21 tablet Eustace Moore, MD     Controlled Substance Prescriptions [employer] Controlled Substance Registry consulted? Not Applicable   Eustace Moore, MD 07/06/17 [phone]

## 2017-07-06 NOTE — ED Triage Notes (Signed)
The patient presented to the [facility] with a complaint of left ear pain x 4 days. 

## 2017-07-06 NOTE — Discharge Instructions (Signed)
Take the prednisone as directed Take all of day one today Use warm compress to jaw area SOFT food only a few days May take ibuprofen for pain AFTER the prednisone Follow up with your doctor if not better in a week

## 2017-07-15 ENCOUNTER — Encounter (INDEPENDENT_AMBULATORY_CARE_PROVIDER_SITE_OTHER): Payer: Self-pay | Admitting: Physical Medicine and Rehabilitation

## 2018-06-06 ENCOUNTER — Other Ambulatory Visit: Payer: Self-pay | Admitting: Family Medicine

## 2018-06-06 DIAGNOSIS — Z1231 Encounter for screening mammogram for malignant neoplasm of breast: Secondary | ICD-10-CM

## 2018-07-21 ENCOUNTER — Ambulatory Visit
Admission: RE | Admit: 2018-07-21 | Discharge: 2018-07-21 | Disposition: A | Discharge status: Home / Self Care | Payer: Medicare HMO | Source: Ambulatory Visit | Attending: Family Medicine | Admitting: Family Medicine

## 2018-07-21 ENCOUNTER — Other Ambulatory Visit: Payer: Self-pay

## 2018-07-21 DIAGNOSIS — Z1231 Encounter for screening mammogram for malignant neoplasm of breast: Secondary | ICD-10-CM | POA: Diagnosis present

## 2019-02-16 ENCOUNTER — Other Ambulatory Visit: Payer: Self-pay

## 2019-02-16 ENCOUNTER — Encounter (HOSPITAL_COMMUNITY): Payer: Self-pay

## 2019-02-16 ENCOUNTER — Ambulatory Visit (HOSPITAL_COMMUNITY): Payer: Medicare HMO

## 2019-02-16 ENCOUNTER — Other Ambulatory Visit: Payer: Self-pay | Admitting: Unknown Physician Specialty

## 2019-02-16 ENCOUNTER — Ambulatory Visit (HOSPITAL_COMMUNITY)
Admission: EM | Admit: 2019-02-16 | Discharge: 2019-02-16 | Disposition: A | Discharge status: Home / Self Care | Payer: Medicare HMO | Attending: Family Medicine | Admitting: Family Medicine

## 2019-02-16 ENCOUNTER — Telehealth: Payer: Self-pay | Admitting: Unknown Physician Specialty

## 2019-02-16 DIAGNOSIS — U071 COVID-19: Secondary | ICD-10-CM | POA: Diagnosis not present

## 2019-02-16 DIAGNOSIS — I10 Essential (primary) hypertension: Secondary | ICD-10-CM

## 2019-02-16 DIAGNOSIS — N182 Chronic kidney disease, stage 2 (mild): Secondary | ICD-10-CM

## 2019-02-16 DIAGNOSIS — I129 Hypertensive chronic kidney disease with stage 1 through stage 4 chronic kidney disease, or unspecified chronic kidney disease: Secondary | ICD-10-CM | POA: Diagnosis not present

## 2019-02-16 LAB — POC SARS CORONAVIRUS 2 AG -  ED: SARS Coronavirus 2 Ag: POSITIVE — AB

## 2019-02-16 LAB — POC SARS CORONAVIRUS 2 AG: SARS Coronavirus 2 Ag: POSITIVE — AB

## 2019-02-16 NOTE — ED Triage Notes (Signed)
Pt states she has had chills, fever and cough this started last night.

## 2019-02-16 NOTE — Discharge Instructions (Addendum)
I am placing a phone call to our Covid Infusion center with your information. Someone from that group will likely contact you in the next 1-2 days and discuss your eligibility for any available treatment.  Drink plenty of fluids and rest.  Take tylenol 547m tablet x 2 up to every 8 hours for sore throat, fever and body aches. Do not exceed 8 tablets in 24 hours  Go directly to the Emergency Department or call 911 if you have severe chest pain, shortness of breath, severe diarrhea or feel as though you might pass out.   You have tested positive for COVID-19 by way of our Rapid Antigen test. You will need to quarantine at this time.   The CDC Guidelines state you may stop quarantine once the below have been met. - It has been 10 days from the onset of symptoms  - You have had no fever for 24 consecutive hours without the use of fever reducing medications such as tylenol or advil - any  Covid related symptoms are improving.

## 2019-02-16 NOTE — Telephone Encounter (Signed)
  I connected by phone with Stacy Khan on 02/16/2019 at 4:32 PM to discuss the potential use of an new treatment for mild to moderate COVID-19 viral infection in non-hospitalized patients.  This patient is a 69 y.o. female that meets the FDA criteria for Emergency Use Authorization of bamlanivimab or casirivimab\imdevimab.  Has a (+) direct SARS-CoV-2 viral test result  Has mild or moderate COVID-19   Is ? 69 years of age and weighs ? 40 kg  Is NOT hospitalized due to COVID-19  Is NOT requiring oxygen therapy or requiring an increase in baseline oxygen flow rate due to COVID-19  Is within 10 days of symptom onset  Has at least one of the high risk factor(s) for progression to severe COVID-19 and/or hospitalization as defined in EUA.  Specific high risk criteria : >/= 69 yo and DM being cared for by [facility] PCP   I have spoken and communicated the following to the patient or parent/caregiver:  1. FDA has authorized the emergency use of bamlanivimab and casirivimab\imdevimab for the treatment of mild to moderate COVID-19 in adults and pediatric patients with positive results of direct SARS-CoV-2 viral testing who are 19 years of age and older weighing at least 40 kg, and who are at high risk for progressing to severe COVID-19 and/or hospitalization.  2. The significant known and potential risks and benefits of bamlanivimab and casirivimab\imdevimab, and the extent to which such potential risks and benefits are unknown.  3. Information on available alternative treatments and the risks and benefits of those alternatives, including clinical trials.  4. Patients treated with bamlanivimab and casirivimab\imdevimab should continue to self-isolate and use infection control measures (e.g., wear mask, isolate, social distance, avoid sharing personal items, clean and disinfect "high touch" surfaces, and frequent handwashing) according to CDC guidelines.   5. The patient or parent/caregiver  has the option to accept or refuse bamlanivimab or casirivimab\imdevimab .  After reviewing this information with the patient, The patient agreed to proceed with receiving the bamlanimivab infusion and will be provided a copy of the Fact sheet prior to receiving the infusion.Gabriel Cirri 02/16/2019 4:32 PM  Sx onset 02/15/19 Chills, fever

## 2019-02-16 NOTE — ED Provider Notes (Signed)
[place]    CSN: [id number] Arrival date & time: 02/16/19  1222      History   Chief Complaint Chief Complaint  Patient presents with  . Fever    HPI Stacy Khan is a 69 y.o. female.   Patient reports to urgent care today for fever, bodyache and chills. Symptoms started last night. She took several doses of tylenol and a combination cold medications. She endorses a slight cough and congestion. She reports fever to 99 degrees.  She denies shortness of breath, chest pain, nausea, vomiting, diarrhea, loss or change of smell.   She does not report a known exposure to covid. 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      Past Medical History:  Diagnosis Date  . Diabetes mellitus without complication ([place])   . Hypertension     Patient Active Problem List   Diagnosis Date Noted  . Cervicalgia 05/10/2017    History reviewed. No pertinent surgical history.  OB History   No obstetric history on file.      Home Medications    Prior to Admission medications   Medication Sig Start Date End Date Taking? Authorizing Provider  metFORMIN (GLUCOPHAGE) 500 MG tablet Take by mouth 2 (two) times daily with a meal.    [provider]  predniSONE (STERAPRED UNI-PAK 21 TAB) 10 MG (21) TBPK tablet tad 07/06/17   Raylene Everts, MD    Family History Family History  Problem Relation Age of Onset  . Breast cancer Maternal Aunt     Social History Social History   Tobacco Use  . Smoking status: Never Smoker  . Smokeless tobacco: Never Used  Substance Use Topics  . Alcohol use: No  . Drug use: No     Allergies   Patient has no known allergies.   Review of Systems Review of Systems  Constitutional: Positive for chills, fatigue and fever.  HENT: Positive for congestion. Negative for ear pain, rhinorrhea, sinus pressure, sinus pain and sore throat.   Eyes: Negative for pain and visual  disturbance.  Respiratory: Positive for cough. Negative for shortness of breath.   Cardiovascular: Negative for chest pain and palpitations.  Gastrointestinal: Negative for abdominal pain, diarrhea, nausea and vomiting.  Genitourinary: Negative for dysuria and hematuria.  Musculoskeletal: Positive for myalgias. Negative for arthralgias and back pain.  Skin: Negative for color change and rash.  Neurological: Negative for dizziness, seizures, syncope and headaches.  All other systems reviewed and are negative.    Physical Exam Triage Vital Signs ED Triage Vitals  Enc Vitals Group     BP 02/16/19 1353 (!) 178/79     Pulse Rate 02/16/19 1353 85     Resp 02/16/19 1353 16     Temp 02/16/19 1353 99.4 F (37.4 C)     Temp Source 02/16/19 1353 Oral     SpO2 02/16/19 1353 98 %     Weight 02/16/19 1401 150 lb (68 kg)     Height --      Head Circumference --      Peak Flow --      Pain Score 02/16/19 1401 3     Pain Loc --      Pain Edu? --      Excl. in [place]? --    No data found.  Updated Vital Signs BP (!) 178/79 (BP Location: Left Arm)   Pulse 85   Temp  99.4 F (37.4 C) (Oral)   Resp 16   Wt 150 lb (68 kg)   SpO2 98%   BMI 29.45 kg/m   Visual Acuity Right Eye Distance:   Left Eye Distance:   Bilateral Distance:    Right Eye Near:   Left Eye Near:    Bilateral Near:     Physical Exam Vitals and nursing note reviewed.  Constitutional:      General: She is not in acute distress.    Appearance: Normal appearance. She is well-developed. She is not ill-appearing.  HENT:     Head: Normocephalic and atraumatic.     Nose: Nose normal. No congestion.  Eyes:     Conjunctiva/sclera: Conjunctivae normal.  Cardiovascular:     Rate and Rhythm: Normal rate and regular rhythm.     Heart sounds: No murmur.  Pulmonary:     Effort: Pulmonary effort is normal. No respiratory distress.     Breath sounds: Normal breath sounds. No wheezing, rhonchi or rales.  Abdominal:      Palpations: Abdomen is soft.     Tenderness: There is no abdominal tenderness.  Musculoskeletal:     Cervical back: Neck supple.     Right lower leg: No edema.     Left lower leg: No edema.  Skin:    General: Skin is warm and dry.  Neurological:     Mental Status: She is alert and oriented to person, place, and time.  Psychiatric:        Mood and Affect: Mood normal.        Behavior: Behavior normal.        Thought Content: Thought content normal.        Judgment: Judgment normal.      UC Treatments / Results  Labs (all labs ordered are listed, but only abnormal results are displayed) Labs Reviewed  POC SARS CORONAVIRUS 2 AG -  ED - Abnormal; Notable for the following components:      Result Value   SARS Coronavirus 2 Ag POSITIVE (*)    All other components within normal limits  POC SARS CORONAVIRUS 2 AG - Abnormal; Notable for the following components:   SARS Coronavirus 2 Ag POSITIVE (*)    All other components within normal limits    EKG   Radiology No results found.  Procedures Procedures (including critical care time)  Medications Ordered in UC Medications - No data to display  Initial Impression / Assessment and Plan / UC Course  I have reviewed the triage vital signs and the nursing notes.  Pertinent labs & imaging results that were available during my care of the patient were reviewed by me and considered in my medical decision making (see chart for details).     #COVID 19 - Patient has rapid covid positive test. She is doing well besides body ache and chills. Given comorbidity and age, infusion center left message. Educated on proper dosages of tylenol and to avoid tylenol and combination medications. Strict ED precautions discussed. Patient understands plan.     Final Clinical Impressions(s) / UC Diagnoses   Final diagnoses:  [id number]  Elevated blood pressure reading in office with diagnosis of hypertension     Discharge Instructions     I am  placing a phone call to our Covid Infusion center with your information. Someone from that group will likely contact you in the next 1-2 days and discuss your eligibility for any available treatment.  Drink plenty of fluids and  rest.  Take tylenol 515m tablet x 2 up to every 8 hours for sore throat, fever and body aches. Do not exceed 8 tablets in 24 hours  Go directly to the Emergency Department or call 911 if you have severe chest pain, shortness of breath, severe diarrhea or feel as though you might pass out.   You have tested positive for COVID-19 by way of our Rapid Antigen test. You will need to quarantine at this time.   The CDC Guidelines state you may stop quarantine once the below have been met. - It has been 10 days from the onset of symptoms  - You have had no fever for 24 consecutive hours without the use of fever reducing medications such as tylenol or advil - any  Covid related symptoms are improving.       ED Prescriptions    None     PDMP not reviewed this encounter.   DPurnell Shoemaker PA-C 02/16/19 1442

## 2019-02-17 ENCOUNTER — Telehealth: Payer: Self-pay | Admitting: Nurse Practitioner

## 2019-02-17 NOTE — Telephone Encounter (Signed)
Called to Discuss with patient about Covid symptoms and the use of bamlanivimab, a monoclonal antibody infusion for those with mild to moderate Covid symptoms and at a high risk of hospitalization.     Pt is qualified for this infusion at the [facility] infusion center due to co-morbid conditions and/or a member of an at-risk group.     Unable to reach pt  

## 2019-02-20 ENCOUNTER — Ambulatory Visit (HOSPITAL_COMMUNITY)
Admission: RE | Admit: 2019-02-20 | Discharge: 2019-02-20 | Disposition: A | Discharge status: Home / Self Care | Payer: Medicare Other | Source: Ambulatory Visit | Attending: Pulmonary Disease | Admitting: Pulmonary Disease

## 2019-02-20 DIAGNOSIS — Z23 Encounter for immunization: Secondary | ICD-10-CM | POA: Diagnosis not present

## 2019-02-20 DIAGNOSIS — U071 COVID-19: Secondary | ICD-10-CM | POA: Diagnosis present

## 2019-02-20 MED ORDER — ACETAMINOPHEN 325 MG PO TABS
650.0000 mg | ORAL_TABLET | Freq: Four times a day (QID) | ORAL | Status: DC | PRN
  Administered 2019-02-20: 650 mg via ORAL

## 2019-02-20 MED ORDER — DIPHENHYDRAMINE HCL 50 MG/ML IJ SOLN: 50.0000 mg | Freq: Once | INTRAMUSCULAR | Status: DC | PRN

## 2019-02-20 MED ORDER — ALBUTEROL SULFATE HFA 108 (90 BASE) MCG/ACT IN AERS: 2.0000 | INHALATION_SPRAY | Freq: Once | RESPIRATORY_TRACT | Status: DC | PRN

## 2019-02-20 MED ORDER — SODIUM CHLORIDE 0.9 % IV SOLN
700.0000 mg | Freq: Once | INTRAVENOUS | Status: AC
  Administered 2019-02-20: 700 mg via INTRAVENOUS
  Filled 2019-02-20: qty 20

## 2019-02-20 MED ORDER — EPINEPHRINE 0.3 MG/0.3ML IJ SOAJ: 0.3000 mg | Freq: Once | INTRAMUSCULAR | Status: DC | PRN

## 2019-02-20 MED ORDER — ACETAMINOPHEN 325 MG PO TABS
ORAL_TABLET | ORAL | Status: AC
  Filled 2019-02-20: qty 2

## 2019-02-20 MED ORDER — METHYLPREDNISOLONE SODIUM SUCC 125 MG IJ SOLR: 125.0000 mg | Freq: Once | INTRAMUSCULAR | Status: DC | PRN

## 2019-02-20 MED ORDER — SODIUM CHLORIDE 0.9 % IV SOLN
INTRAVENOUS | Status: DC | PRN
  Administered 2019-02-20: 250 mL via INTRAVENOUS

## 2019-02-20 MED ORDER — FAMOTIDINE IN NACL 20-0.9 MG/50ML-% IV SOLN: 20.0000 mg | Freq: Once | INTRAVENOUS | Status: DC | PRN

## 2019-02-20 NOTE — Discharge Instructions (Signed)

## 2019-02-20 NOTE — Progress Notes (Signed)
  Diagnosis: COVID-19  Physician: Dr. Wright  Procedure: [facility] Infusion Clinic Med: bamlanivimab infusion - Provided patient with bamlanimivab fact sheet for patients, parents and caregivers prior to infusion.  Complications: No immediate complications noted.  Discharge: Discharged home   Shelley Cocke R Trish Mancinelli 02/20/2019   

## 2019-07-04 ENCOUNTER — Other Ambulatory Visit: Payer: Self-pay | Admitting: Family Medicine

## 2019-07-04 DIAGNOSIS — N644 Mastodynia: Secondary | ICD-10-CM

## 2019-10-19 ENCOUNTER — Other Ambulatory Visit: Payer: Self-pay | Admitting: Family Medicine

## 2019-10-19 DIAGNOSIS — N644 Mastodynia: Secondary | ICD-10-CM

## 2019-11-09 ENCOUNTER — Other Ambulatory Visit: Payer: Medicare Other

## 2019-11-30 ENCOUNTER — Other Ambulatory Visit: Payer: Self-pay

## 2019-11-30 ENCOUNTER — Ambulatory Visit
Admission: RE | Admit: 2019-11-30 | Discharge: 2019-11-30 | Disposition: A | Discharge status: Home / Self Care | Payer: Medicare HMO | Source: Ambulatory Visit | Attending: Family Medicine | Admitting: Family Medicine

## 2019-11-30 DIAGNOSIS — N644 Mastodynia: Secondary | ICD-10-CM

## 2020-07-10 ENCOUNTER — Other Ambulatory Visit: Payer: Self-pay | Admitting: Family Medicine

## 2020-07-10 DIAGNOSIS — Z1231 Encounter for screening mammogram for malignant neoplasm of breast: Secondary | ICD-10-CM

## 2020-10-22 ENCOUNTER — Other Ambulatory Visit: Payer: Self-pay

## 2020-10-22 MED ORDER — JARDIANCE 10 MG PO TABS
10.0000 mg | ORAL_TABLET | Freq: Every day | ORAL | 11 refills | Status: DC
  Filled 2020-10-22: qty 30, 30d supply, fill #0

## 2020-12-05 ENCOUNTER — Ambulatory Visit
Admission: RE | Admit: 2020-12-05 | Discharge: 2020-12-05 | Disposition: A | Discharge status: Home / Self Care | Payer: Medicare HMO | Source: Ambulatory Visit | Attending: Family Medicine | Admitting: Family Medicine

## 2020-12-05 ENCOUNTER — Other Ambulatory Visit: Payer: Self-pay

## 2020-12-05 DIAGNOSIS — Z1231 Encounter for screening mammogram for malignant neoplasm of breast: Secondary | ICD-10-CM | POA: Insufficient documentation

## 2021-02-14 ENCOUNTER — Other Ambulatory Visit: Payer: Self-pay | Admitting: Family Medicine

## 2021-02-14 DIAGNOSIS — Z13820 Encounter for screening for osteoporosis: Secondary | ICD-10-CM

## 2021-06-11 ENCOUNTER — Other Ambulatory Visit: Payer: Self-pay | Admitting: Family Medicine

## 2021-06-11 DIAGNOSIS — Z1231 Encounter for screening mammogram for malignant neoplasm of breast: Secondary | ICD-10-CM

## 2021-12-08 ENCOUNTER — Ambulatory Visit
Admission: RE | Admit: 2021-12-08 | Discharge: 2021-12-08 | Disposition: A | Discharge status: Home / Self Care | Payer: Medicare HMO | Source: Ambulatory Visit | Attending: Family Medicine | Admitting: Family Medicine

## 2021-12-08 DIAGNOSIS — Z1231 Encounter for screening mammogram for malignant neoplasm of breast: Secondary | ICD-10-CM | POA: Insufficient documentation

## 2022-04-02 ENCOUNTER — Ambulatory Visit (INDEPENDENT_AMBULATORY_CARE_PROVIDER_SITE_OTHER): Payer: Medicare HMO

## 2022-04-02 ENCOUNTER — Ambulatory Visit (HOSPITAL_COMMUNITY)
Admission: EM | Admit: 2022-04-02 | Discharge: 2022-04-02 | Disposition: A | Discharge status: Home / Self Care | Payer: Medicare HMO | Attending: Family | Admitting: Family

## 2022-04-02 ENCOUNTER — Encounter (HOSPITAL_COMMUNITY): Payer: Self-pay | Admitting: Emergency Medicine

## 2022-04-02 DIAGNOSIS — R079 Chest pain, unspecified: Secondary | ICD-10-CM | POA: Diagnosis not present

## 2022-04-02 DIAGNOSIS — E1122 Type 2 diabetes mellitus with diabetic chronic kidney disease: Secondary | ICD-10-CM | POA: Insufficient documentation

## 2022-04-02 DIAGNOSIS — R059 Cough, unspecified: Secondary | ICD-10-CM

## 2022-04-02 DIAGNOSIS — J0100 Acute maxillary sinusitis, unspecified: Secondary | ICD-10-CM | POA: Insufficient documentation

## 2022-04-02 DIAGNOSIS — Z7984 Long term (current) use of oral hypoglycemic drugs: Secondary | ICD-10-CM | POA: Insufficient documentation

## 2022-04-02 DIAGNOSIS — R509 Fever, unspecified: Secondary | ICD-10-CM

## 2022-04-02 DIAGNOSIS — E785 Hyperlipidemia, unspecified: Secondary | ICD-10-CM | POA: Diagnosis not present

## 2022-04-02 DIAGNOSIS — N189 Chronic kidney disease, unspecified: Secondary | ICD-10-CM | POA: Insufficient documentation

## 2022-04-02 DIAGNOSIS — I129 Hypertensive chronic kidney disease with stage 1 through stage 4 chronic kidney disease, or unspecified chronic kidney disease: Secondary | ICD-10-CM | POA: Insufficient documentation

## 2022-04-02 DIAGNOSIS — Z79899 Other long term (current) drug therapy: Secondary | ICD-10-CM | POA: Diagnosis not present

## 2022-04-02 DIAGNOSIS — Z1152 Encounter for screening for COVID-19: Secondary | ICD-10-CM | POA: Diagnosis not present

## 2022-04-02 DIAGNOSIS — J40 Bronchitis, not specified as acute or chronic: Secondary | ICD-10-CM | POA: Insufficient documentation

## 2022-04-02 DIAGNOSIS — R051 Acute cough: Secondary | ICD-10-CM

## 2022-04-02 LAB — POC INFLUENZA A AND B ANTIGEN (URGENT CARE ONLY)
INFLUENZA A ANTIGEN, POC: NEGATIVE
INFLUENZA B ANTIGEN, POC: NEGATIVE

## 2022-04-02 LAB — SARS CORONAVIRUS 2 (TAT 6-24 HRS): SARS Coronavirus 2: NEGATIVE

## 2022-04-02 MED ORDER — AMOXICILLIN-POT CLAVULANATE 875-125 MG PO TABS: 1.0000 | ORAL_TABLET | Freq: Two times a day (BID) | ORAL | 0 refills | Status: AC

## 2022-04-02 MED ORDER — BENZONATATE 200 MG PO CAPS: 200.0000 mg | ORAL_CAPSULE | Freq: Three times a day (TID) | ORAL | 0 refills | Status: AC | PRN

## 2022-04-02 NOTE — ED Triage Notes (Signed)
Pt c/o cough and congestion for 3 weeks. Taking Tylenol, Motrin, Robitussin

## 2022-04-02 NOTE — Discharge Instructions (Signed)
Recommend start Augmentin 847m twice a day with food for 7 days. Continue to push fluids to help loosen up mucus in chest. Continue OTC Tylenol 10052mevery 8 hours as needed for fever or pain. May take Tessalon cough pills every 8 hours as needed for cough. Rest. If cough worsens, fever continues over 102 and any difficulty breathing occurs, go to the ER ASAP. Otherwise, follow-up with your PCP in 3 to 4 days if minimal improvement.

## 2022-04-02 NOTE — ED Provider Notes (Signed)
[facility]    CSN: [id number] Arrival date & time: 04/02/22  0827      History   Chief Complaint Chief Complaint  Patient presents with   Cough   Nasal Congestion    HPI Stacy Khan is a 72 y.o. female.   72 year old female presents with nasal congestion and cough for over 3 weeks. Continues to have some nasal congestion but now more cough and chest congestion. Symptoms have gotten worse over the past 48 hours with fever, worsening of cough- especially at night, headache, and fatigue. No GI symptoms. Took a home COVID test which was negative. Granddaughter recently had Influenza. She has taken Tylenol, Motrin, Robitussin with no relief. Has history of HTN- usually controlled on Losartan. Also has hyperlipidemia and currently on Lipitor '10mg'$  daily. Has history of type 2 DM with some chronic kidney disease. Currently on Metformin. Has been prescribed Jardiance but did not have it filled. No tobacco use or history of lung disease.   The history is provided by the patient.    Past Medical History:  Diagnosis Date   Diabetes mellitus without complication ([place])    Hypertension     Patient Active Problem List   Diagnosis Date Noted   Cervicalgia 05/10/2017    History reviewed. No pertinent surgical history.  OB History   No obstetric history on file.      Home Medications    Prior to Admission medications   Medication Sig Start Date End Date Taking? Authorizing Provider  amoxicillin-clavulanate (AUGMENTIN) 875-125 MG tablet Take 1 tablet by mouth every 12 (twelve) hours for 7 days. 04/02/22 04/09/22 Yes Blayde Bacigalupi, Nicholes Stairs, NP  atorvastatin (LIPITOR) 10 MG tablet Take 1 tablet by mouth daily. 12/30/20  Yes [provider]  benzonatate (TESSALON) 200 MG capsule Take 1 capsule (200 mg total) by mouth every 8 (eight) hours as needed for cough. 04/02/22  Yes Achille Xiang, Nicholes Stairs, NP  losartan (COZAAR) 50 MG tablet Take 50 mg by mouth daily.   Yes [provider]  metFORMIN (GLUCOPHAGE) 500 MG tablet Take by mouth 2 (two) times daily with a meal.    [provider]    Family History Family History  Problem Relation Age of Onset   Breast cancer Maternal Aunt     Social History Social History   Tobacco Use   Smoking status: Never   Smokeless tobacco: Never  Substance Use Topics   Alcohol use: No   Drug use: No     Allergies   Patient has no known allergies.   Review of Systems Review of Systems  Constitutional:  Positive for activity change, chills, fatigue and fever. Negative for diaphoresis.  HENT:  Positive for congestion, hearing loss, postnasal drip, sinus pressure and sore throat (irritated). Negative for ear discharge, ear pain, mouth sores, rhinorrhea and trouble swallowing.   Eyes:  Negative for discharge, redness and itching.  Respiratory:  Positive for cough, chest tightness, shortness of breath and wheezing.   Cardiovascular:  Negative for chest pain.  Gastrointestinal:  Negative for diarrhea, nausea and vomiting.  Musculoskeletal:  Positive for arthralgias and myalgias. Negative for neck stiffness.  Skin:  Negative for color change and rash.  Allergic/Immunologic: Negative for environmental allergies and food allergies.  Neurological:  Positive for light-headedness and headaches. Negative for tremors, seizures, syncope, facial asymmetry, speech difficulty and numbness.  Hematological:  Negative for adenopathy. Does not bruise/bleed easily.     Physical Exam Triage Vital Signs  ED Triage Vitals  Enc Vitals Group     BP 04/02/22 0928 (!) 159/84     Pulse --      Resp 04/02/22 0928 18     Temp 04/02/22 0928 (!) 100.9 F (38.3 C)     Temp Source 04/02/22 0928 Oral     SpO2 04/02/22 0928 96 %     Weight --      Height --      Head Circumference --      Peak Flow --      Pain Score 04/02/22 0927 0     Pain Loc --      Pain Edu? --      Excl. in [place]? --    No data found.  Updated Vital  Signs BP (!) 159/84 (BP Location: Left Arm)   Temp (!) 100.9 F (38.3 C) (Oral)   Resp 18   SpO2 96%   Visual Acuity Right Eye Distance:   Left Eye Distance:   Bilateral Distance:    Right Eye Near:   Left Eye Near:    Bilateral Near:     Physical Exam Vitals and nursing note reviewed.  Constitutional:      General: She is awake. She is not in acute distress.    Appearance: She is well-developed. She is ill-appearing.     Comments: She is sitting in the exam chair in no acute distress but appears tired and ill. No increased work of breathing but coughing frequently and productive.   HENT:     Head: Normocephalic and atraumatic.     Right Ear: Tympanic membrane, ear canal and external ear normal. Decreased hearing noted.     Left Ear: Tympanic membrane, ear canal and external ear normal. Decreased hearing noted.     Ears:     Comments: Wears bilateral hearing aids    Nose: Congestion present.     Right Sinus: Maxillary sinus tenderness present. No frontal sinus tenderness.     Left Sinus: Maxillary sinus tenderness present. No frontal sinus tenderness.     Mouth/Throat:     Lips: Pink.     Mouth: Mucous membranes are moist.     Pharynx: Uvula midline. Posterior oropharyngeal erythema present. No pharyngeal swelling, oropharyngeal exudate or uvula swelling.  Eyes:     Extraocular Movements: Extraocular movements intact.     Conjunctiva/sclera: Conjunctivae normal.  Cardiovascular:     Rate and Rhythm: Normal rate and regular rhythm.     Heart sounds: Normal heart sounds. No murmur heard.    Comments: Heart rate = 96 Pulmonary:     Effort: Pulmonary effort is normal. No tachypnea, bradypnea, accessory muscle usage or respiratory distress.     Breath sounds: Normal air entry. No decreased air movement. Examination of the right-upper field reveals wheezing and rhonchi. Examination of the left-upper field reveals wheezing and rhonchi. Examination of the right-middle field  reveals wheezing. Examination of the right-lower field reveals wheezing. Examination of the left-lower field reveals wheezing. Wheezing and rhonchi present. No decreased breath sounds or rales.  Musculoskeletal:     Cervical back: Normal range of motion and neck supple.  Lymphadenopathy:     Cervical: No cervical adenopathy.  Skin:    General: Skin is warm and dry.     Capillary Refill: Capillary refill takes less than 2 seconds.     Findings: No rash.  Neurological:     General: No focal deficit present.     Mental Status: She  is alert and oriented to person, place, and time.  Psychiatric:        Mood and Affect: Mood normal.        Behavior: Behavior normal. Behavior is cooperative.        Thought Content: Thought content normal.        Judgment: Judgment normal.      UC Treatments / Results  Labs (all labs ordered are listed, but only abnormal results are displayed) Labs Reviewed  SARS CORONAVIRUS 2 (TAT 6-24 HRS)  POC INFLUENZA A AND B ANTIGEN (URGENT CARE ONLY)    EKG   Radiology DG Chest 2 View  Result Date: 04/02/2022 CLINICAL DATA:  Fever, chest pain, and cough. EXAM: CHEST - 2 VIEW COMPARISON:  Chest x-ray dated March 25, 2017. FINDINGS: The heart size and mediastinal contours are within normal limits. Both lungs are clear. The visualized skeletal structures are unremarkable. IMPRESSION: No active cardiopulmonary disease. Electronically Signed   By: Titus Dubin M.D.   On: 04/02/2022 10:06    Procedures Procedures (including critical care time)  Medications Ordered in UC Medications - No data to display  Initial Impression / Assessment and Plan / UC Course  I have reviewed the triage vital signs and the nursing notes.  Pertinent labs & imaging results that were available during my care of the patient were reviewed by me and considered in my medical decision making (see chart for details).     Discussed with patient that she may have a sinus infection  with bronchitis that may have developed into pneumonia due to recent onset of fever and worsening of symptoms- will obtain a chest x-ray. Also reviewed that recent onset of fever may indicate development of Influenza in addition to sinus and bronchitis.  Reviewed negative rapid Influenza test results with patient.  Reviewed negative chest x-ray results with patient.   Consulted with Dr. Lanny Cramp, MD. Patient is stable. Concern about false negative rapid test result for Influenza due to recent exposure or possible early pneumonia that has not developed on chest x-ray yet and discussed treatment options. Will treat for sinus infection and bronchitis with antibiotics as an outpatient and will not add an anti-viral medication at this time. Will also test for COVID. Will start Augmentin '875mg'$  twice a day for 7 days. Continue to push fluids to help loosen up mucus in chest. May take Tessalon cough pills '200mg'$  every 8 hours as needed. May continue OTC Tylenol '1000mg'$  every 8 hours as needed for fever or pain. Rest. If cough continues to worsen with more chest pain, fever continues over 102 or any difficulty breathing occurs, go to the ER ASAP. Otherwise, follow-up pending COVID test results and with her PCP in 3 to 4 days if minimal improvement.  Final Clinical Impressions(s) / UC Diagnoses   Final diagnoses:  Acute cough  Acute non-recurrent maxillary sinusitis  Bronchitis     Discharge Instructions      Recommend start Augmentin '875mg'$  twice a day with food for 7 days. Continue to push fluids to help loosen up mucus in chest. Continue OTC Tylenol '1000mg'$  every 8 hours as needed for fever or pain. May take Tessalon cough pills every 8 hours as needed for cough. Rest. If cough worsens, fever continues over 102 and any difficulty breathing occurs, go to the ER ASAP. Otherwise, follow-up with your PCP in 3 to 4 days if minimal improvement.      ED Prescriptions     Medication Sig Dispense Auth.  Provider    amoxicillin-clavulanate (AUGMENTIN) 875-125 MG tablet Take 1 tablet by mouth every 12 (twelve) hours for 7 days. 14 tablet Curt Oatis, Nicholes Stairs, NP   benzonatate (TESSALON) 200 MG capsule Take 1 capsule (200 mg total) by mouth every 8 (eight) hours as needed for cough. 21 capsule Josalin Carneiro, Nicholes Stairs, NP      PDMP not reviewed this encounter.   Katy Apo, NP 04/03/22 [phone]

## 2022-07-21 IMAGING — MG MM DIGITAL SCREENING BILAT W/ TOMO AND CAD
8 series · 8 of 24 positions shown · non-contrast
Comparison: Previous exam(s).

ACR Breast Density Category a: The breast tissue is almost entirely
fatty.

CLINICAL DATA: Screening.

EXAM:
DIGITAL SCREENING BILATERAL MAMMOGRAM WITH TOMOSYNTHESIS AND CAD
TECHNIQUE: Bilateral screening digital craniocaudal and mediolateral oblique
mammograms were obtained. Bilateral screening digital breast
tomosynthesis was performed. The images were evaluated with
computer-aided detection.

[L MLO synth-2D]
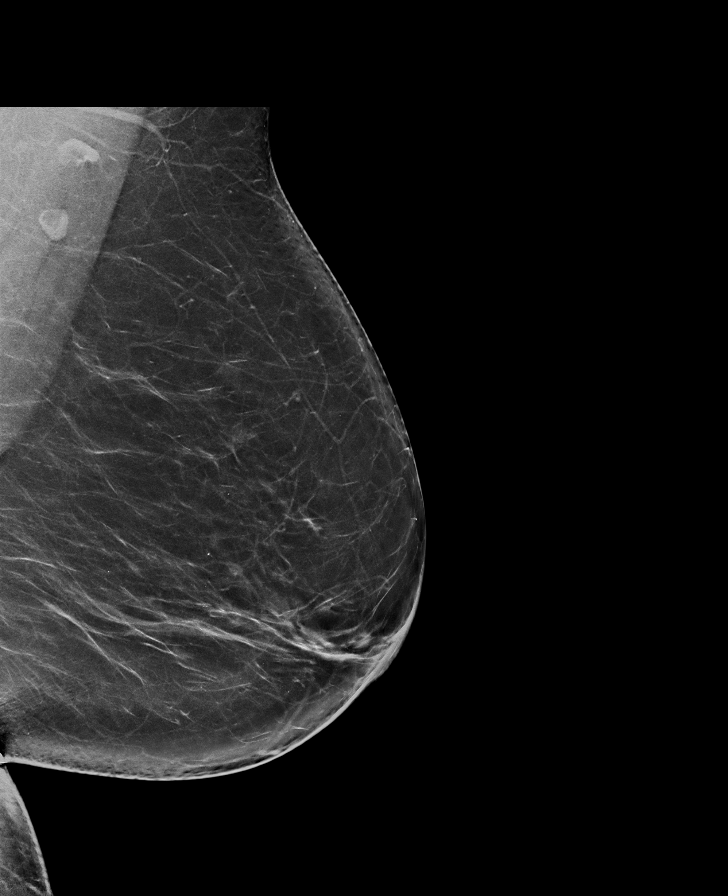

[R MLO synth-2D]
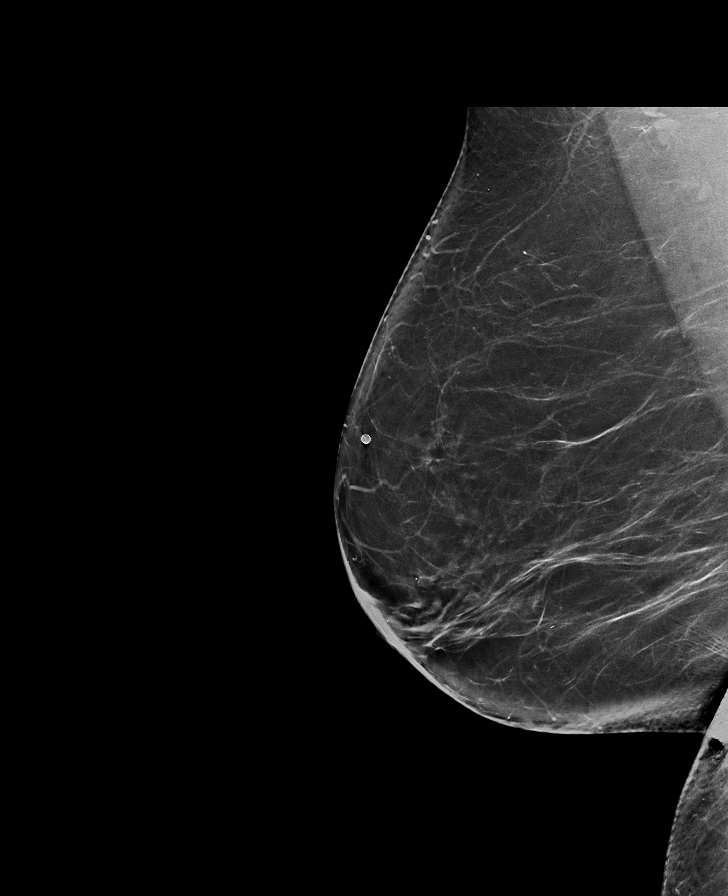

[L CC synth-2D]
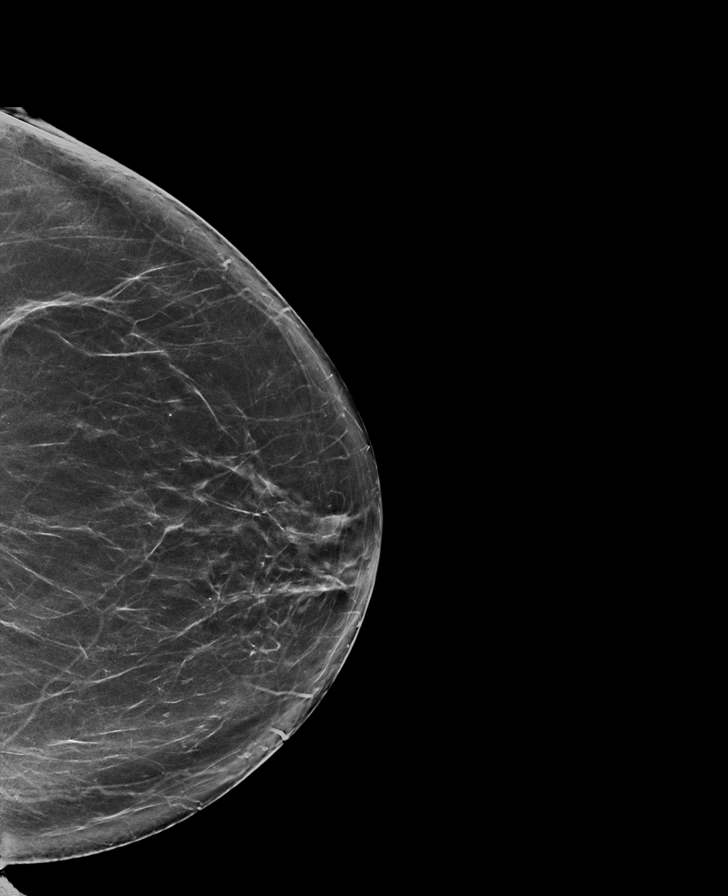

[R CC synth-2D]
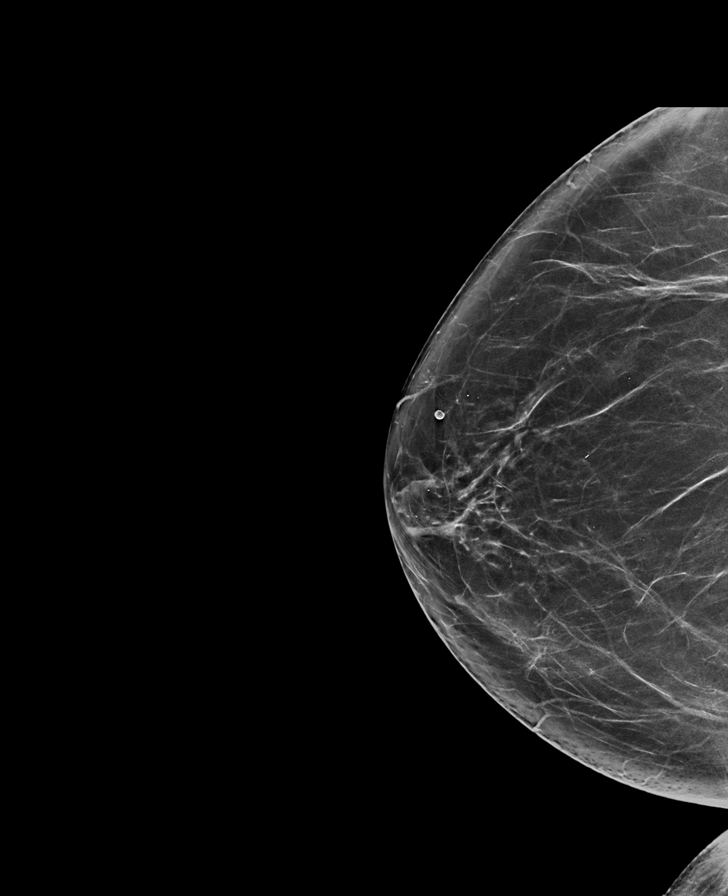

[L MLO tomo · tomo slice 42/83.0]
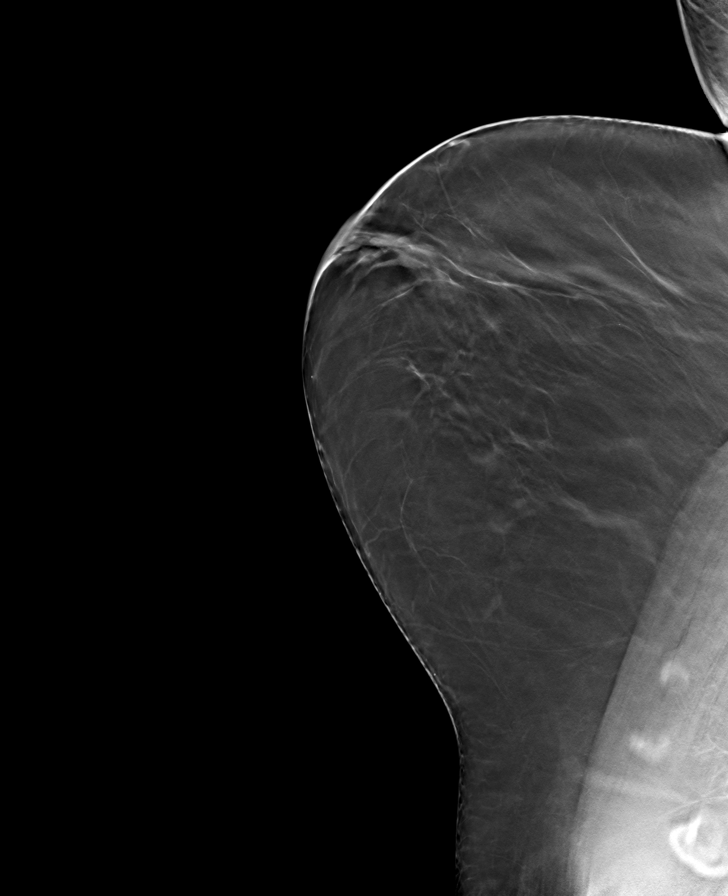

[L CC tomo · tomo slice 39/76.0]
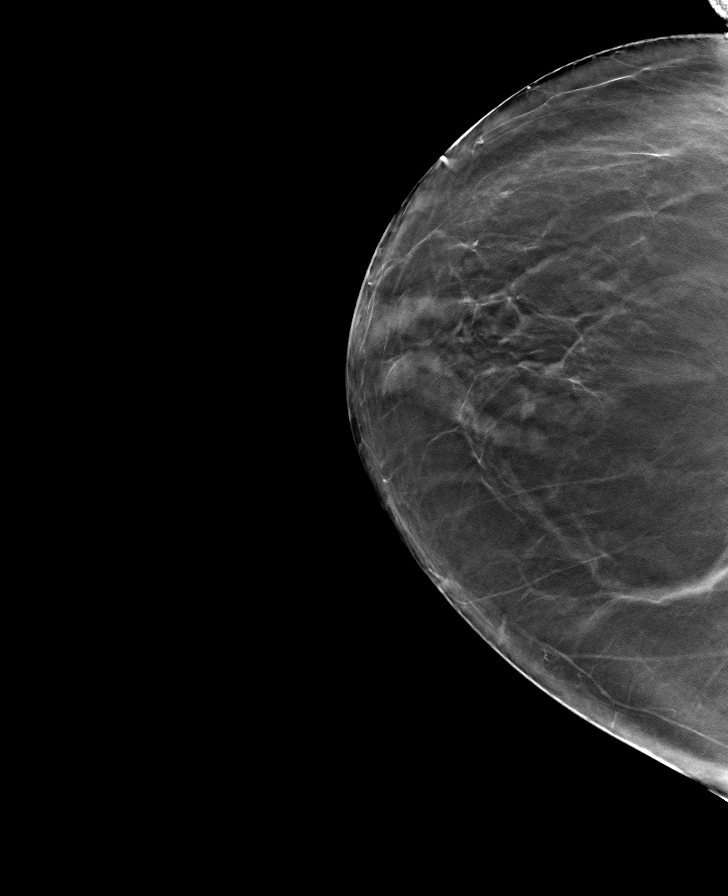

[R CC tomo · tomo slice 37/73.0]
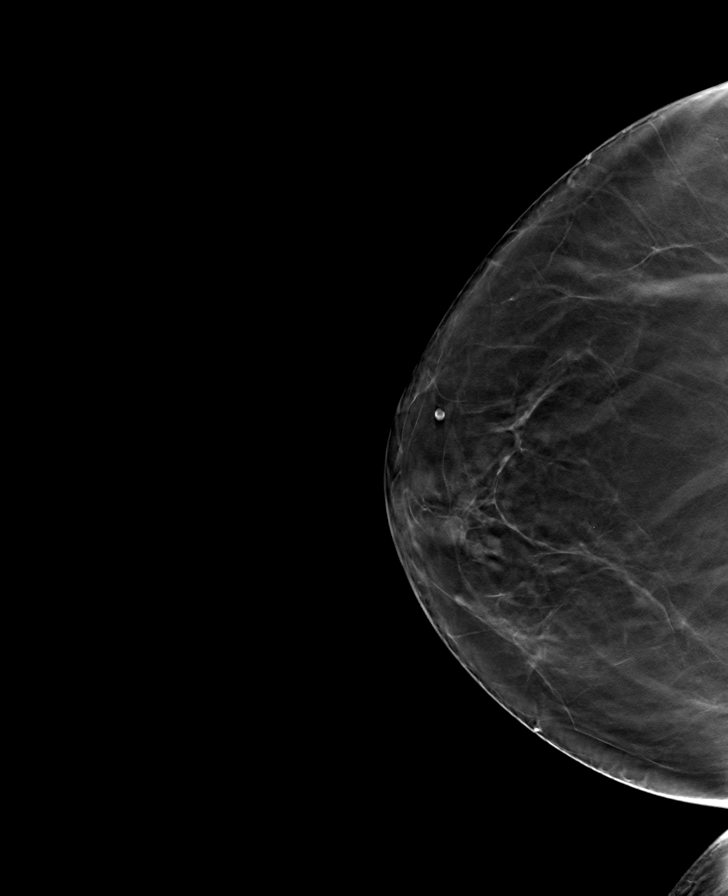

[R MLO tomo · tomo slice 41/80.0]
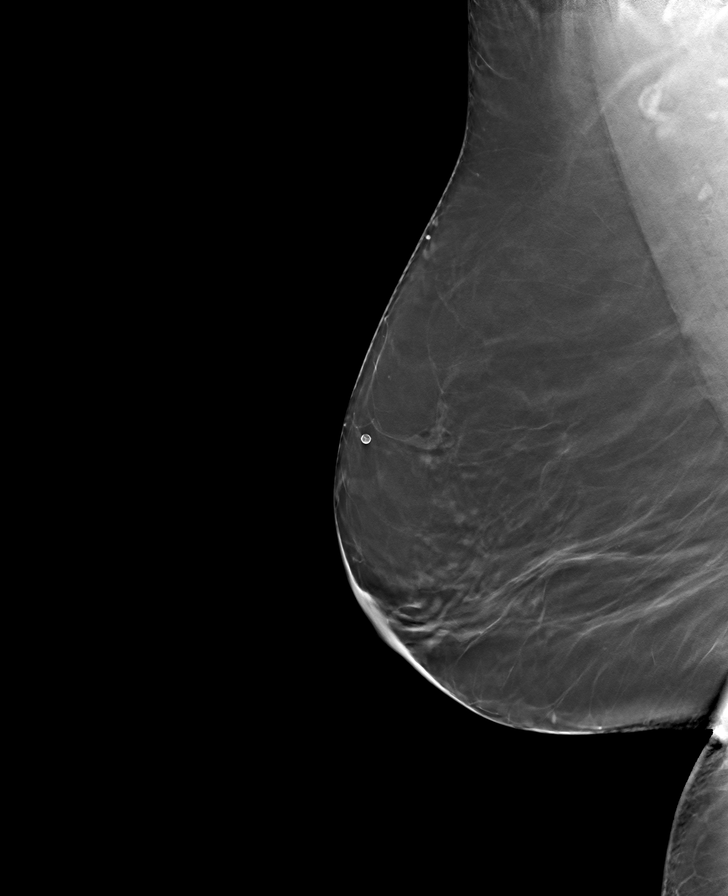

[8 of 24 positions shown; findings below may reference images not displayed]

FINDINGS: There are no findings suspicious for malignancy.
IMPRESSION: No mammographic evidence of malignancy. A result letter of this
screening mammogram will be mailed directly to the patient.

RECOMMENDATION:
Screening mammogram in one year. (Code:0E-3-N98)

BI-RADS CATEGORY  1: Negative.

## 2022-11-21 DIAGNOSIS — Z1231 Encounter for screening mammogram for malignant neoplasm of breast: Secondary | ICD-10-CM

## 2022-12-17 ENCOUNTER — Ambulatory Visit
Admission: RE | Admit: 2022-12-17 | Discharge: 2022-12-17 | Disposition: A | Discharge status: Home / Self Care | Payer: Medicare HMO | Source: Ambulatory Visit | Attending: Family Medicine | Admitting: Family Medicine

## 2022-12-17 DIAGNOSIS — Z1231 Encounter for screening mammogram for malignant neoplasm of breast: Secondary | ICD-10-CM

## 2023-11-22 ENCOUNTER — Other Ambulatory Visit: Payer: Self-pay | Admitting: Family Medicine

## 2023-11-22 DIAGNOSIS — Z1231 Encounter for screening mammogram for malignant neoplasm of breast: Secondary | ICD-10-CM

## 2023-12-17 ENCOUNTER — Other Ambulatory Visit: Payer: Self-pay | Admitting: Family Medicine

## 2023-12-17 DIAGNOSIS — Z780 Asymptomatic menopausal state: Secondary | ICD-10-CM

## 2023-12-20 ENCOUNTER — Ambulatory Visit
Admission: RE | Admit: 2023-12-20 | Discharge: 2023-12-20 | Disposition: A | Source: Ambulatory Visit | Attending: Family Medicine | Admitting: Family Medicine

## 2023-12-20 DIAGNOSIS — Z1231 Encounter for screening mammogram for malignant neoplasm of breast: Secondary | ICD-10-CM
